# Patient Record
Sex: Male | Born: 1990 | Hispanic: Yes | Marital: Married | Smoking: Current some day smoker
Health system: Southern US, Community
[De-identification: ages and names within clinical notes are randomized; demographics above are authoritative.]

## PROBLEM LIST (undated history)

## (undated) DIAGNOSIS — R61 Generalized hyperhidrosis: Secondary | ICD-10-CM

## (undated) DIAGNOSIS — K219 Gastro-esophageal reflux disease without esophagitis: Secondary | ICD-10-CM

## (undated) HISTORY — DX: Generalized hyperhidrosis: R61

## (undated) HISTORY — DX: Gastro-esophageal reflux disease without esophagitis: K21.9

---
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
## 2014-11-14 ENCOUNTER — Emergency Department (HOSPITAL_COMMUNITY)
Admission: EM | Admit: 2014-11-14 | Discharge: 2014-11-14 | Disposition: A | Discharge status: Home / Self Care | Payer: Worker's Compensation | Attending: Emergency Medicine | Admitting: Emergency Medicine

## 2014-11-14 ENCOUNTER — Encounter (HOSPITAL_COMMUNITY): Payer: Self-pay | Admitting: Emergency Medicine

## 2014-11-14 ENCOUNTER — Emergency Department (HOSPITAL_COMMUNITY): Payer: Worker's Compensation

## 2014-11-14 DIAGNOSIS — M25561 Pain in right knee: Secondary | ICD-10-CM | POA: Diagnosis not present

## 2014-11-14 DIAGNOSIS — Z720 Tobacco use: Secondary | ICD-10-CM | POA: Insufficient documentation

## 2014-11-14 MED ORDER — IBUPROFEN 600 MG PO TABS: 600.0000 mg | ORAL_TABLET | Freq: Three times a day (TID) | ORAL | Status: DC | PRN

## 2014-11-14 MED ORDER — ACETAMINOPHEN 325 MG PO TABS
650.0000 mg | ORAL_TABLET | Freq: Once | ORAL | Status: AC
  Administered 2014-11-14: 650 mg via ORAL
  Filled 2014-11-14: qty 2

## 2014-11-14 MED ORDER — IBUPROFEN 800 MG PO TABS
800.0000 mg | ORAL_TABLET | Freq: Once | ORAL | Status: AC
  Administered 2014-11-14: 800 mg via ORAL
  Filled 2014-11-14: qty 1

## 2014-11-14 NOTE — ED Notes (Signed)
Patient c/o right knee pain x 3 days.  Patient denies any injury.  Ambulatory with steady gait.

## 2014-11-14 NOTE — ED Notes (Signed)
Patient states he took a Hydrocodone ( 500 mg) medication today at 5:00 am.

## 2014-11-14 NOTE — ED Provider Notes (Signed)
CSN: [id number]     Arrival date & time 11/14/14  1478 History   First MD Initiated Contact with Patient 11/14/14 [phone]     Chief Complaint  Patient presents with  . Knee Pain      HPI Patient presents to the emergency department complaining of ongoing right knee pain over the past 3 days.  No known injury or trauma.  He does remember some acute onset pain.  Reports the pain is located on his medial aspect of his right knee.  He states painful ambulation.  No fevers or chills.  Denies swelling of his right lower extremity.  Patient never had pain or discomfort like this before.  He works in [employer] as a Curator.  He is frequently on his feet and bending.  He does not work on his knees.he does play soccer once a week but does not remember any injury while playing.  He did not play this week.  No numbness or weakness of his leg.  Able to extend at the knee joint.   History reviewed. No pertinent past medical history. History reviewed. No pertinent past surgical history. No family history on file. Social History  Substance Use Topics  . Smoking status: Current Some Day Smoker  . Smokeless tobacco: None  . Alcohol Use: Yes    Review of Systems  All other systems reviewed and are negative.     Allergies  Review of patient's allergies indicates no known allergies.  Home Medications   Prior to Admission medications   Medication Sig Start Date End Date Taking? Authorizing Provider  HYDROcodone-acetaminophen (NORCO/VICODIN) 5-325 MG per tablet Take 1-2 tablets by mouth every 6 (six) hours as needed for moderate pain or severe pain.  10/31/14  Yes Historical Provider, MD   BP 128/61 mmHg  Pulse 69  Temp(Src) 97.6 F (36.4 C) (Oral)  Resp 16  Ht  (1.854 m)  Wt 235 lb (106.595 kg)  BMI 31.01 kg/m2  SpO2 100% Physical Exam  Constitutional: He is oriented to person, place, and time. He appears well-developed and well-nourished.  HENT:  Head: Normocephalic.   Eyes: EOM are normal.  Neck: Normal range of motion.  Pulmonary/Chest: Effort normal.  Abdominal: He exhibits no distension.  Musculoskeletal: Normal range of motion.  Mild tenderness the medial aspect of the right knee with some tenderness to the superior medial aspect of the right patella.  Normal line patella.  No obvious joint effusion.  Normal pulses in right foot.  Full range of motion of right knee although with some pain past 45.  Able to extend at the knee joint.  Neurological: He is alert and oriented to person, place, and time.  Psychiatric: He has a normal mood and affect.  Nursing note and vitals reviewed.   ED Course  Procedures (including critical care time) Labs Review Labs Reviewed - No data to display  Imaging Review Dg Knee Complete 4 Views Right  11/14/2014   CLINICAL DATA:  Increasing medial left knee pain for 3 days. No known injury.  EXAM: RIGHT KNEE - COMPLETE 4+ VIEW  COMPARISON:  None.  FINDINGS: There is no evidence of fracture, subluxation or dislocation.  Mild medial and lateral compartment narrowing identified.  No focal bony lesions or joint effusion identified.  IMPRESSION: Mild medial lateral compartment joint space narrowing without other significant abnormality.   Electronically Signed   By: Harmon Pier M.D.   On: 11/14/2014 10:22   I have personally reviewed and  evaluated these images and lab results as part of my medical decision-making.   EKG Interpretation None      MDM   Final diagnoses:  Right knee pain   X-ray demonstrates possible medial lateral compartment joint space narrowing.  This could be the cause of his pain.  His quad and patellar tendons are intact.  Patient be placed on anti-inflammatories and rest for 48 hours.  Ice as needed.  Outpatient orthopedic follow-up.     Azalia Bilis, MD 11/14/14 1052

## 2017-02-14 ENCOUNTER — Telehealth: Payer: Self-pay

## 2017-02-19 IMAGING — CR DG KNEE COMPLETE 4+V*R*
4 series · 4 of 4 positions shown · non-contrast
Comparison: None.

CLINICAL DATA: Increasing medial left knee pain for 3 days. No
known injury.

EXAM:
RIGHT KNEE - COMPLETE 4+ VIEW

[t knee ap right]
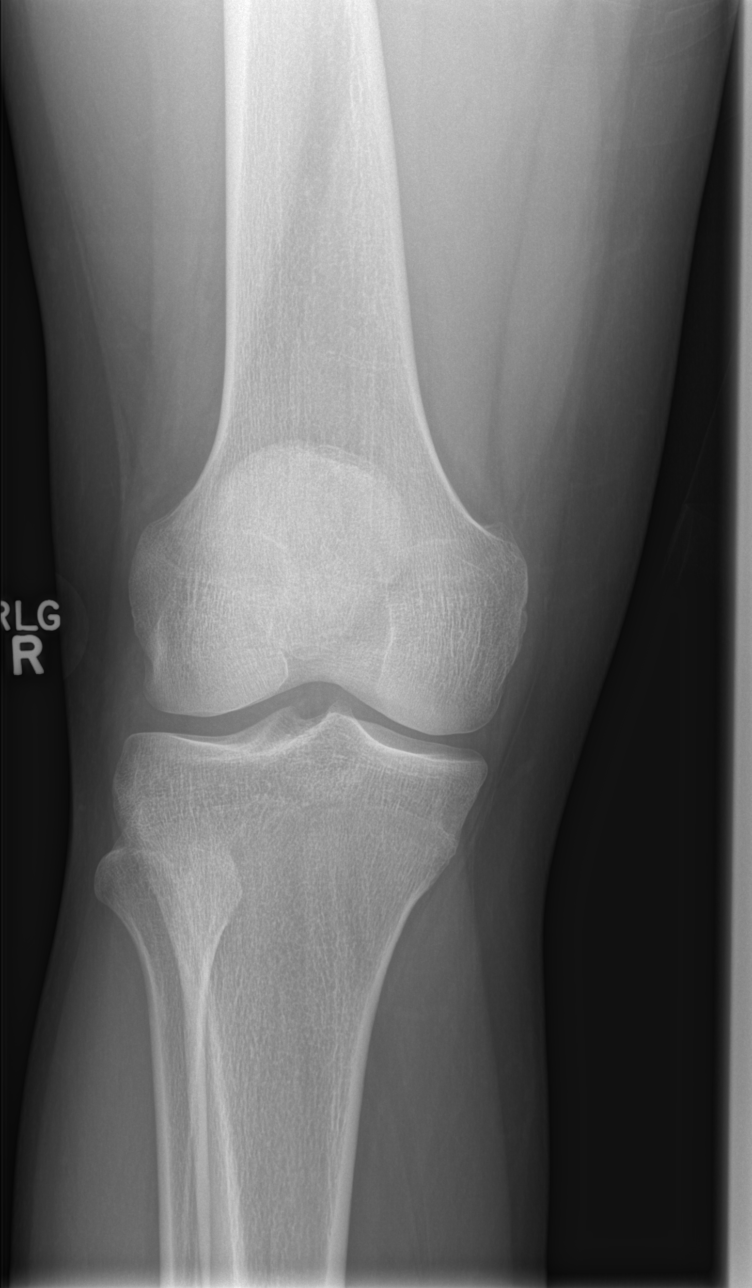

[t knee obl right (1 of 2)]
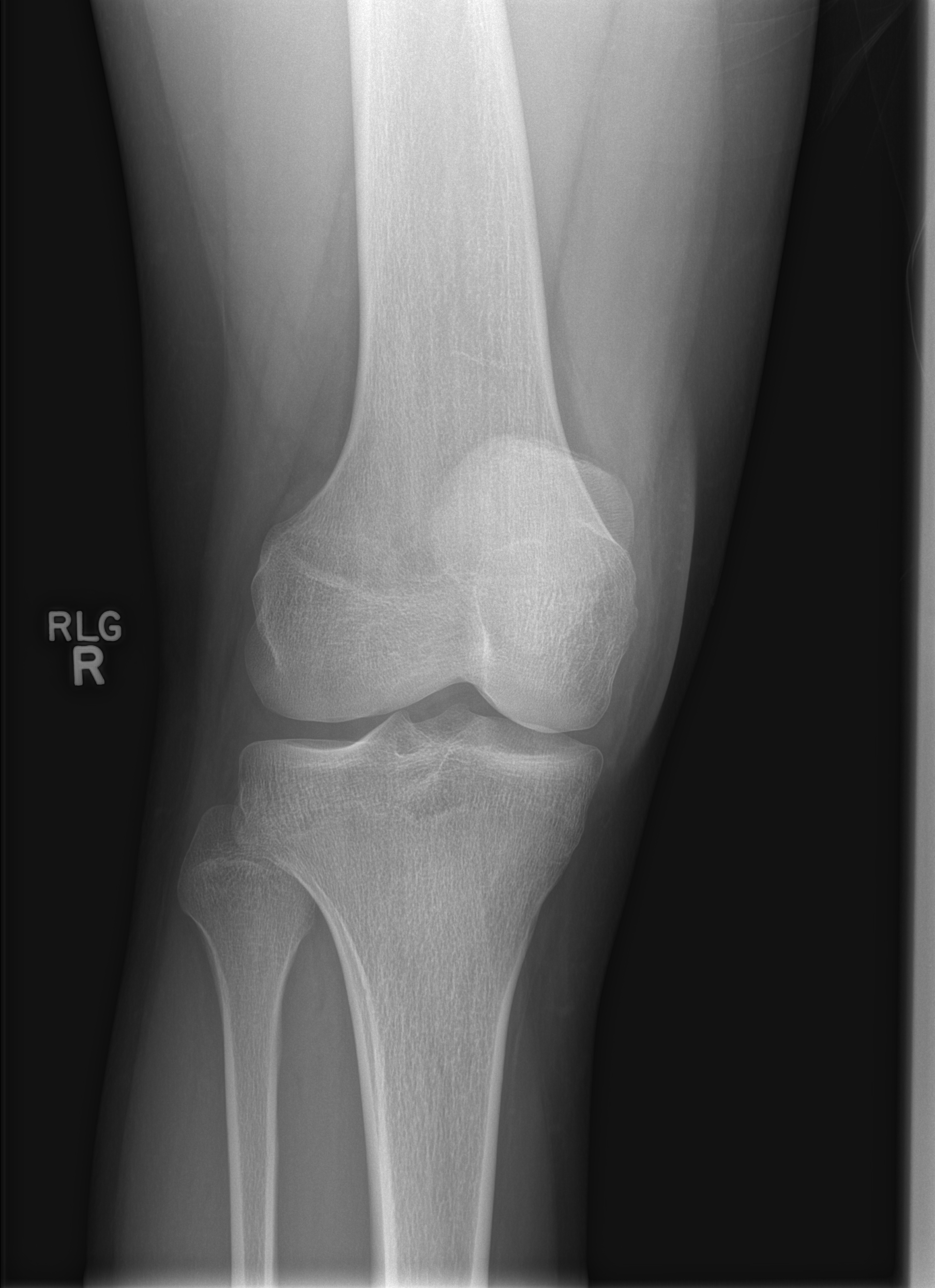

[t knee obl right (2 of 2)]
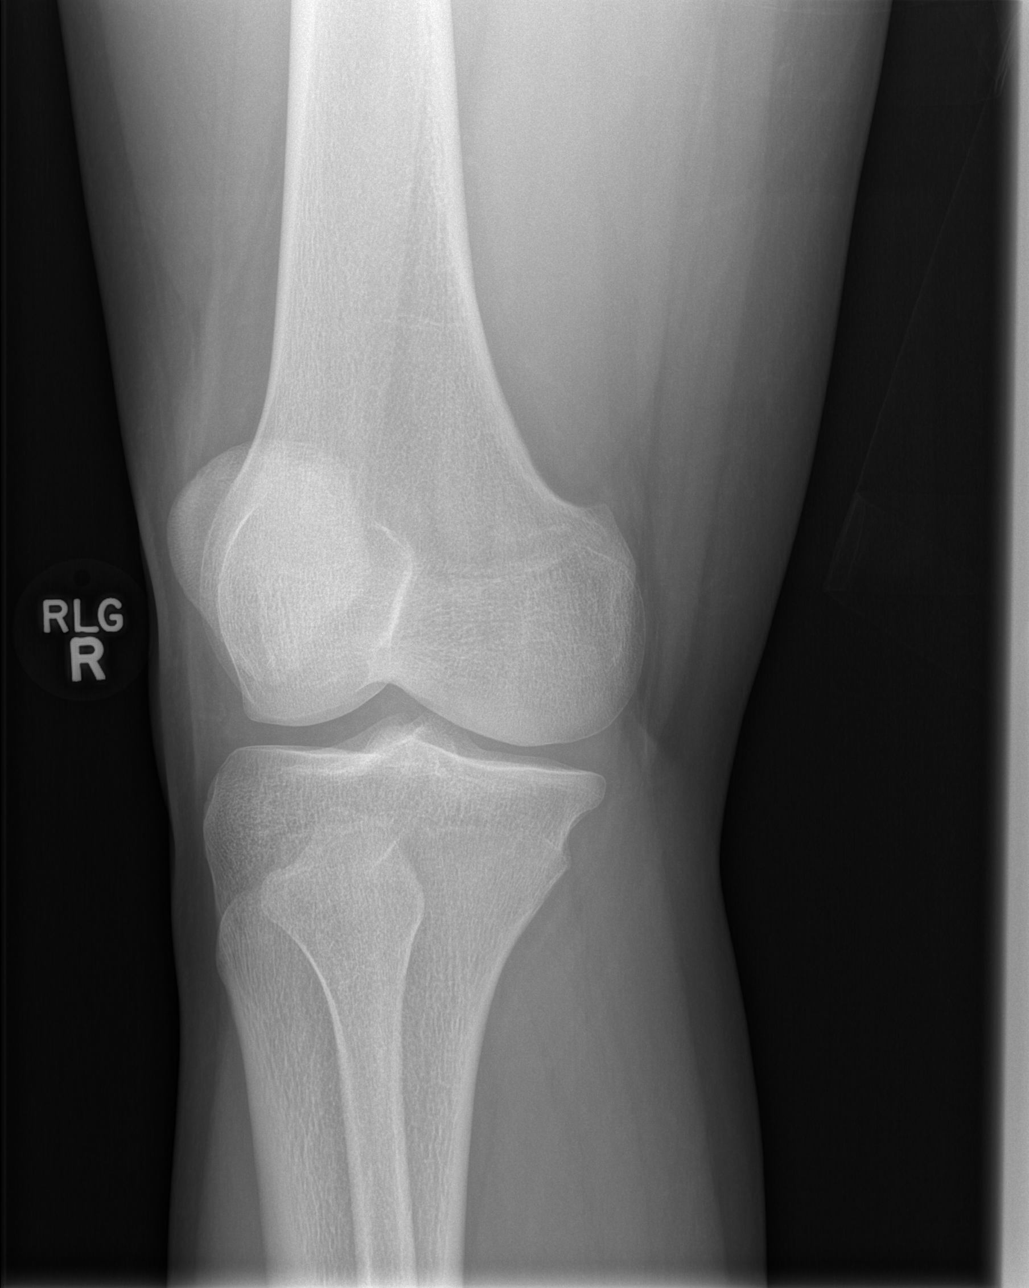

[t knee lat right]
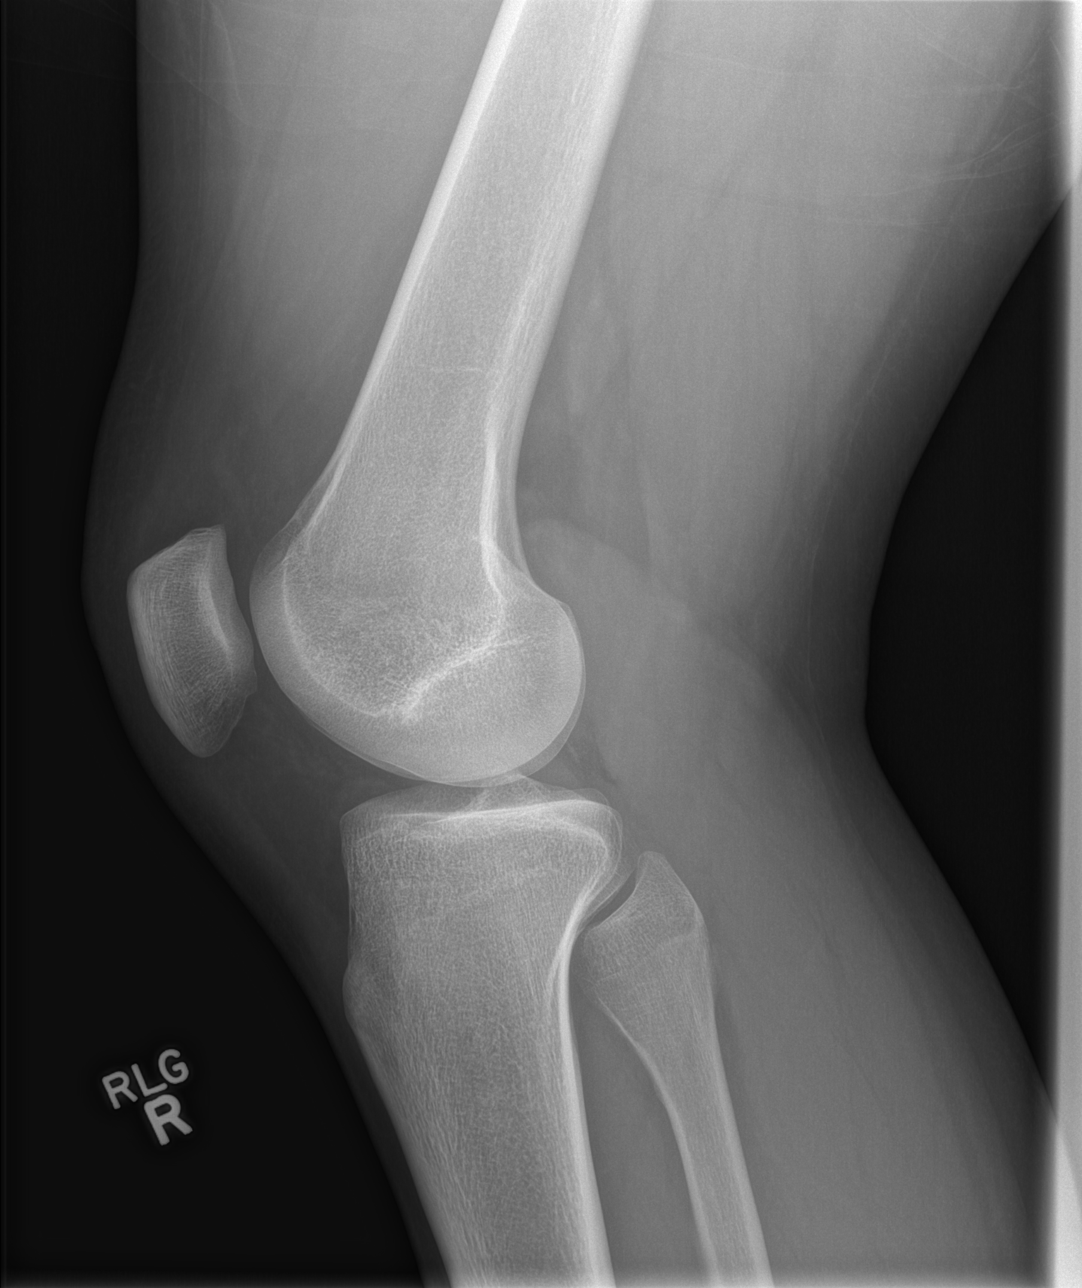

[4 of 4 positions shown; findings below may reference images not displayed]

FINDINGS: There is no evidence of fracture, subluxation or dislocation.

Mild medial and lateral compartment narrowing identified.

No focal bony lesions or joint effusion identified.
IMPRESSION: Mild medial lateral compartment joint space narrowing without other
significant abnormality.

## 2017-02-23 ENCOUNTER — Ambulatory Visit: Payer: Commercial Managed Care - PPO | Admitting: Family Medicine

## 2017-02-23 ENCOUNTER — Encounter: Payer: Self-pay | Admitting: Family Medicine

## 2017-02-23 VITALS — BP 108/70 | HR 78 | Temp 97.9°F | Ht 73.0 in | Wt 245.4 lb

## 2017-02-23 DIAGNOSIS — L74510 Primary focal hyperhidrosis, axilla: Secondary | ICD-10-CM

## 2017-02-23 DIAGNOSIS — R61 Generalized hyperhidrosis: Secondary | ICD-10-CM | POA: Insufficient documentation

## 2017-02-23 DIAGNOSIS — K219 Gastro-esophageal reflux disease without esophagitis: Secondary | ICD-10-CM | POA: Diagnosis not present

## 2017-02-23 MED ORDER — ALUMINUM CHLORIDE 20 % EX SOLN: Freq: Every day | CUTANEOUS | 0 refills | Status: AC

## 2017-02-23 MED ORDER — OMEPRAZOLE 20 MG PO CPDR: 20.0000 mg | DELAYED_RELEASE_CAPSULE | Freq: Two times a day (BID) | ORAL | 2 refills | Status: AC

## 2017-02-23 NOTE — Progress Notes (Signed)
Chief Complaint  Patient presents with  . Establish Care       New Patient Visit SUBJECTIVE: HPI: Matthew Gardner is an 26 y.o.male who is being seen for establishing care.  Abd pain Bloating and fullness after eating spicy or well seasoned foods. 2-3 years. No pain, N/V. Took tums that helped. No bleeding, nighttime awakenings, or unintentional weight loss. Dairy seems to flare things up too. It normally lasts for around 7-8 hours afterwards.   Sweating Many years, armpits sweats profusely.  It seems to be irrespective of how warm it is on side.  He denies night sweats or sweating and other of the body.   No Known Allergies  Past Medical History:  Diagnosis Date  . GERD (gastroesophageal reflux disease)   . Hyperhidrosis    Past Surgical History:  Procedure Laterality Date  . APPENDECTOMY  2003   Social History   Socioeconomic History  . Marital status: Married  Tobacco Use  . Smoking status: Current Some Day Smoker  . Smokeless tobacco: Never Used  Substance and Sexual Activity  . Alcohol use: Yes  . Drug use: No   Family History  Problem Relation Age of Onset  . Cancer Neg Hx      Current Outpatient Medications:  .  aluminum chloride (DRYSOL) 20 % external solution, Apply topically at bedtime., Disp: 35 mL, Rfl: 0 .  omeprazole (PRILOSEC) 20 MG capsule, Take 1 capsule (20 mg total) by mouth 2 (two) times daily before a meal., Disp: 60 capsule, Rfl: 2  ROS Cardiovascular: Denies chest pain  Respiratory: Denies dyspnea   OBJECTIVE: BP 108/70 (BP Location: Left Arm, Patient Position: Sitting, Cuff Size: Large)   Pulse 78   Temp 97.9 F (36.6 C) (Oral)   Ht 6\' 1"  (1.854 m)   Wt 245 lb 6 oz (111.3 kg)   SpO2 97%   BMI 32.37 kg/m   Constitutional: -  VS reviewed -  Well developed, well nourished, appears stated age -  No apparent distress  Psychiatric: -  Oriented to person, place, and time -  Memory intact -  Affect and mood normal -  Fluent conversation,  good eye contact -  Judgment and insight age appropriate  Eye: -  Conjunctivae clear, no discharge -  Pupils symmetric, round, reactive to light  ENMT: -  MMM    Pharynx moist, no exudate, no erythema  Neck: -  No gross swelling, no palpable masses -  Thyroid midline, not enlarged, mobile, no palpable masses  Cardiovascular: -  RRR -  No LE edema  Respiratory: -  Normal respiratory effort, no accessory muscle use, no retraction -  Breath sounds equal, no wheezes, no ronchi, no crackles  Gastrointestinal: -  Bowel sounds normal -  No tenderness, no distention, no guarding, no masses  Skin: -  No significant lesion on inspection -  Warm and dry to palpation   ASSESSMENT/PLAN: Gastroesophageal reflux disease, esophagitis presence not specified  Hyperhidrosis of axilla  Hyperhidrosis   Reflux precautions discussed. Start PPI for 2 mo. Drysol. Declined flu, will offer PCV23 given smoking status at f/u. Patient should return in 6 weeks for CPE. The patient voiced understanding and agreement to the plan.   Jilda Rocheicholas Paul KinmundyWendling, DO 02/23/17  2:34 PM

## 2017-02-23 NOTE — Patient Instructions (Addendum)
The only lifestyle changes that have data behind them are weight loss for the overweight/obese and elevating the head of the bed. Finding out which foods/positions are triggers is important.  Drysol (aluminum hydroxide) is something you can use under you arms.   Omeprazole 20 mg twice daily, 30 minutes before food, for the next 2 months.

## 2017-02-23 NOTE — Progress Notes (Signed)
Pre visit review using our clinic review tool, if applicable. No additional management support is needed unless otherwise documented below in the visit note. 

## 2017-04-06 ENCOUNTER — Encounter: Payer: Commercial Managed Care - PPO | Admitting: Family Medicine
